# Patient Record
Sex: Female | Born: 1965 | Race: White | Hispanic: No | Marital: Married | Smoking: Never smoker
Health system: Southern US, Community
[De-identification: ages and names within clinical notes are randomized; demographics above are authoritative.]

## PROBLEM LIST (undated history)

## (undated) DIAGNOSIS — D649 Anemia, unspecified: Secondary | ICD-10-CM

## (undated) DIAGNOSIS — M549 Dorsalgia, unspecified: Secondary | ICD-10-CM

## (undated) DIAGNOSIS — G8929 Other chronic pain: Secondary | ICD-10-CM

## (undated) DIAGNOSIS — J45909 Unspecified asthma, uncomplicated: Secondary | ICD-10-CM

## (undated) HISTORY — PX: ABDOMINAL HYSTERECTOMY: SHX81

## (undated) HISTORY — PX: BREAST BIOPSY: SHX20

## (undated) HISTORY — PX: OTHER SURGICAL HISTORY: SHX169

## (undated) HISTORY — PX: FOOT SURGERY: SHX648

## (undated) HISTORY — PX: BACK SURGERY: SHX140

## (undated) HISTORY — PX: HERNIA REPAIR: SHX51

---

## 2014-06-11 ENCOUNTER — Emergency Department (HOSPITAL_COMMUNITY)
Admission: EM | Admit: 2014-06-11 | Discharge: 2014-06-11 | Disposition: A | Discharge status: Home / Self Care | Payer: BLUE CROSS/BLUE SHIELD | Attending: Emergency Medicine | Admitting: Emergency Medicine

## 2014-06-11 ENCOUNTER — Encounter (HOSPITAL_COMMUNITY): Payer: Self-pay | Admitting: Emergency Medicine

## 2014-06-11 ENCOUNTER — Emergency Department (HOSPITAL_COMMUNITY): Payer: BLUE CROSS/BLUE SHIELD

## 2014-06-11 DIAGNOSIS — Y998 Other external cause status: Secondary | ICD-10-CM | POA: Diagnosis not present

## 2014-06-11 DIAGNOSIS — Z79899 Other long term (current) drug therapy: Secondary | ICD-10-CM | POA: Diagnosis not present

## 2014-06-11 DIAGNOSIS — Y929 Unspecified place or not applicable: Secondary | ICD-10-CM | POA: Insufficient documentation

## 2014-06-11 DIAGNOSIS — S93402A Sprain of unspecified ligament of left ankle, initial encounter: Secondary | ICD-10-CM | POA: Diagnosis not present

## 2014-06-11 DIAGNOSIS — S90812A Abrasion, left foot, initial encounter: Secondary | ICD-10-CM | POA: Diagnosis not present

## 2014-06-11 DIAGNOSIS — W19XXXA Unspecified fall, initial encounter: Secondary | ICD-10-CM

## 2014-06-11 DIAGNOSIS — J45909 Unspecified asthma, uncomplicated: Secondary | ICD-10-CM | POA: Diagnosis not present

## 2014-06-11 DIAGNOSIS — G8929 Other chronic pain: Secondary | ICD-10-CM | POA: Diagnosis not present

## 2014-06-11 DIAGNOSIS — Z862 Personal history of diseases of the blood and blood-forming organs and certain disorders involving the immune mechanism: Secondary | ICD-10-CM | POA: Insufficient documentation

## 2014-06-11 DIAGNOSIS — Y9301 Activity, walking, marching and hiking: Secondary | ICD-10-CM | POA: Insufficient documentation

## 2014-06-11 DIAGNOSIS — S99912A Unspecified injury of left ankle, initial encounter: Secondary | ICD-10-CM | POA: Diagnosis present

## 2014-06-11 DIAGNOSIS — W109XXA Fall (on) (from) unspecified stairs and steps, initial encounter: Secondary | ICD-10-CM | POA: Diagnosis not present

## 2014-06-11 HISTORY — DX: Unspecified asthma, uncomplicated: J45.909

## 2014-06-11 HISTORY — DX: Dorsalgia, unspecified: M54.9

## 2014-06-11 HISTORY — DX: Other chronic pain: G89.29

## 2014-06-11 HISTORY — DX: Anemia, unspecified: D64.9

## 2014-06-11 NOTE — ED Notes (Signed)
Pt refuse crutches

## 2014-06-11 NOTE — ED Notes (Signed)
Pt states she was coming down the steps and they were wet and she slipped and fell  Pt is c/o left foot and ankle pain  Pt has some swelling and abrasions to the top of the foot

## 2014-06-11 NOTE — Discharge Instructions (Signed)
Use crutches as needed for assistance. Wear ankle brace as needed for support. Refer to attached documents for more information.

## 2014-06-11 NOTE — ED Provider Notes (Signed)
CSN: [id number]     Arrival date & time 06/11/14  2124 History   First MD Initiated Contact with Patient 06/11/14 2219   This chart was scribed for non-physician practitioner, Emilia BeckKaitlyn Porchia Sinkler, PA-C, working with Geoffery Lyonsouglas Delo, MD by Marica OtterNusrat Rahman, ED Scribe. This patient was seen in room WTR9/WTR9 and the patient's care was started at 11:29 PM.   Chief Complaint  Patient presents with  . Fall  . Ankle Injury  . Foot Pain   The history is provided by the patient. No language interpreter was used.   PCP: No primary care provider on file. HPI Comments: Heidi Bass is a 49 y.o. female, with PMH noted below, who presents to the Emergency Department complaining of a fall with associated left foot pain, left ankle pain, and superficial abrasions over the left foot  onset this evening. Pt was walking down some steps when she slipped and fell. Pt denies head trauma, loc, neck pain, or any other Sx at this time.   Past Medical History  Diagnosis Date  . Asthma   . Anemia   . Chronic back pain    Past Surgical History  Procedure Laterality Date  . Back surgery    . C sections    . Abdominal hysterectomy    . Foot surgery    . Hernia repair     No family history on file. History  Substance Use Topics  . Smoking status: Never Smoker   . Smokeless tobacco: Not on file  . Alcohol Use: No   OB History    No data available     Review of Systems  Musculoskeletal: Negative for neck pain.       Left foot pain and left ankle pain  Skin: Positive for wound (superficial abrasions to top of left foot).  All other systems reviewed and are negative.  Allergies  Oxycodone-acetaminophen  Home Medications   Prior to Admission medications   Medication Sig Start Date End Date Taking? Authorizing Provider  B Complex-C (B-COMPLEX WITH VITAMIN C) tablet Take 1 tablet by mouth daily.   Yes Historical Provider, MD  cyanocobalamin (,VITAMIN B-12,) 1000 MCG/ML injection Inject 1,000 mcg into the  muscle every 30 (thirty) days.  06/07/14  Yes Historical Provider, MD  MAGNESIUM CHLORIDE ER PO Take 1 tablet by mouth daily.   Yes Historical Provider, MD  naproxen sodium (ANAPROX) 220 MG tablet Take 440 mg by mouth 2 (two) times daily as needed (pain).   Yes Historical Provider, MD  traMADol (ULTRAM) 50 MG tablet Take 100 mg by mouth every 6 (six) hours as needed for moderate pain.   Yes Historical Provider, MD  VENTOLIN HFA 108 (90 BASE) MCG/ACT inhaler Inhale 2 puffs into the lungs every 4 (four) hours as needed for wheezing.  03/09/14   Historical Provider, MD   Triage Vitals: BP 125/79 mmHg  Pulse 57  Temp(Src) 98.6 F (37 C) (Oral)  Resp 18  SpO2 99% Physical Exam  Constitutional: She is oriented to person, place, and time. She appears well-developed and well-nourished. No distress.  HENT:  Head: Normocephalic and atraumatic.  Eyes: Conjunctivae and EOM are normal.  Neck: Neck supple.  Cardiovascular: Normal rate.   Pulmonary/Chest: Effort normal. No respiratory distress.  Musculoskeletal: Normal range of motion.  Limited ROM of left ankle due to pain, tenderness to palpation over medial and lateral malleolus, no obvious deformity.   Neurological: She is alert and oriented to person, place, and time.  Skin: Skin is  warm and dry.  Psychiatric: She has a normal mood and affect. Her behavior is normal.  Nursing note and vitals reviewed.   ED Course  Procedures (including critical care time) DIAGNOSTIC STUDIES: Oxygen Saturation is 99% on RA, nl by my interpretation.    COORDINATION OF CARE: 11:31 PM-Discussed treatment plan with pt at bedside and pt agreed to plan. Pt offered meds for pain, but declined.   Labs Review Labs Reviewed - No data to display  Imaging Review Dg Ankle Complete Left  06/11/2014   CLINICAL DATA:  Slip and fall down stairs, foot and ankle pain. Redness and swelling.  EXAM: LEFT ANKLE COMPLETE - 3+ VIEW  COMPARISON:  None.  FINDINGS: No fracture or  dislocation. The alignment and joint spaces are maintained. The ankle mortise is preserved. No focal soft tissue abnormality about the ankle.  IMPRESSION: Negative.   Electronically Signed   By: Rubye Oaks M.D.   On: 06/11/2014 23:14   Dg Foot Complete Left  06/11/2014   CLINICAL DATA:  Slip and fall down stairs. Foot and ankle injury, pain. Redness and swelling.  EXAM: LEFT FOOT - COMPLETE 3+ VIEW  COMPARISON:  None.  FINDINGS: No fracture or dislocation. The alignment and joint spaces are maintained. Minimal sclerotic density about the fourth metatarsal, likely bone island. 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 There is dorsal soft tissue edema.  IMPRESSION: No fracture or dislocation of the left foot. Dorsal soft tissue edema.   Electronically Signed   By: Rubye Oaks M.D.   On: 06/11/2014 23:15   SPLINT APPLICATION Date/Time: 11:53 PM Authorized by: Emilia Beck Consent: Verbal consent obtained. Risks and benefits: risks, benefits and alternatives were discussed Consent given by: patient Splint applied by: orthopedic technician Location details: left ankle Splint type: ASO brace Supplies used: ASO brace Post-procedure: The splinted body part was neurovascularly unchanged following the procedure. Patient tolerance: Patient tolerated the procedure well with no immediate complications.     EKG Interpretation None      MDM   Final diagnoses:  Fall, initial encounter  Left ankle sprain, initial encounter    Xray shows no acute changes. Patient will have ASO brace and instructions to rest, ice, and elevate.   I personally performed the services described in this documentation, which was scribed in my presence. The recorded information has been reviewed and is accurate.   [address] [place], PA-C 06/12/14 1610  Azalia Bilis, MD 06/12/14 [phone]

## 2014-12-18 ENCOUNTER — Other Ambulatory Visit: Payer: Self-pay

## 2014-12-18 DIAGNOSIS — Z1231 Encounter for screening mammogram for malignant neoplasm of breast: Secondary | ICD-10-CM

## 2015-01-12 ENCOUNTER — Ambulatory Visit
Admission: RE | Admit: 2015-01-12 | Discharge: 2015-01-12 | Disposition: A | Discharge status: Home / Self Care | Payer: BLUE CROSS/BLUE SHIELD | Source: Ambulatory Visit

## 2015-01-12 DIAGNOSIS — Z1231 Encounter for screening mammogram for malignant neoplasm of breast: Secondary | ICD-10-CM

## 2015-04-16 ENCOUNTER — Other Ambulatory Visit: Payer: Self-pay | Admitting: Neurosurgery

## 2015-04-16 DIAGNOSIS — M5416 Radiculopathy, lumbar region: Secondary | ICD-10-CM

## 2015-04-16 DIAGNOSIS — M546 Pain in thoracic spine: Secondary | ICD-10-CM

## 2015-04-16 DIAGNOSIS — G959 Disease of spinal cord, unspecified: Secondary | ICD-10-CM

## 2015-04-24 ENCOUNTER — Ambulatory Visit
Admission: RE | Admit: 2015-04-24 | Discharge: 2015-04-24 | Disposition: A | Discharge status: Home / Self Care | Payer: BLUE CROSS/BLUE SHIELD | Source: Ambulatory Visit | Attending: Neurosurgery | Admitting: Neurosurgery

## 2015-04-24 DIAGNOSIS — M5416 Radiculopathy, lumbar region: Secondary | ICD-10-CM

## 2015-04-24 DIAGNOSIS — M546 Pain in thoracic spine: Secondary | ICD-10-CM

## 2015-04-24 DIAGNOSIS — G959 Disease of spinal cord, unspecified: Secondary | ICD-10-CM

## 2015-04-24 MED ORDER — GADOBENATE DIMEGLUMINE 529 MG/ML IV SOLN
15.0000 mL | Freq: Once | INTRAVENOUS | Status: AC | PRN
Start: 2015-04-24 — End: 2015-04-24
  Administered 2015-04-24: 15 mL via INTRAVENOUS

## 2016-03-24 ENCOUNTER — Other Ambulatory Visit: Payer: Self-pay | Admitting: Obstetrics & Gynecology

## 2016-03-24 DIAGNOSIS — Z1231 Encounter for screening mammogram for malignant neoplasm of breast: Secondary | ICD-10-CM

## 2016-04-11 ENCOUNTER — Ambulatory Visit
Admission: RE | Admit: 2016-04-11 | Discharge: 2016-04-11 | Disposition: A | Discharge status: Home / Self Care | Payer: BLUE CROSS/BLUE SHIELD | Source: Ambulatory Visit | Attending: Obstetrics & Gynecology | Admitting: Obstetrics & Gynecology

## 2016-04-11 DIAGNOSIS — Z1231 Encounter for screening mammogram for malignant neoplasm of breast: Secondary | ICD-10-CM

## 2017-03-09 ENCOUNTER — Other Ambulatory Visit: Payer: Self-pay | Admitting: Obstetrics & Gynecology

## 2017-03-09 DIAGNOSIS — Z1231 Encounter for screening mammogram for malignant neoplasm of breast: Secondary | ICD-10-CM

## 2017-04-13 ENCOUNTER — Ambulatory Visit: Payer: BLUE CROSS/BLUE SHIELD

## 2017-04-27 ENCOUNTER — Ambulatory Visit
Admission: RE | Admit: 2017-04-27 | Discharge: 2017-04-27 | Disposition: A | Discharge status: Home / Self Care | Payer: PRIVATE HEALTH INSURANCE | Source: Ambulatory Visit | Attending: Obstetrics & Gynecology | Admitting: Obstetrics & Gynecology

## 2017-04-27 DIAGNOSIS — Z1231 Encounter for screening mammogram for malignant neoplasm of breast: Secondary | ICD-10-CM

## 2018-06-04 ENCOUNTER — Other Ambulatory Visit: Payer: Self-pay | Admitting: Obstetrics & Gynecology

## 2018-06-04 DIAGNOSIS — Z1231 Encounter for screening mammogram for malignant neoplasm of breast: Secondary | ICD-10-CM

## 2018-07-22 ENCOUNTER — Other Ambulatory Visit: Payer: Self-pay

## 2018-07-22 ENCOUNTER — Ambulatory Visit
Admission: RE | Admit: 2018-07-22 | Discharge: 2018-07-22 | Disposition: A | Discharge status: Home / Self Care | Payer: PRIVATE HEALTH INSURANCE | Source: Ambulatory Visit | Attending: Obstetrics & Gynecology | Admitting: Obstetrics & Gynecology

## 2018-07-22 DIAGNOSIS — Z1231 Encounter for screening mammogram for malignant neoplasm of breast: Secondary | ICD-10-CM

## 2019-08-23 ENCOUNTER — Other Ambulatory Visit: Payer: Self-pay | Admitting: Internal Medicine

## 2019-08-23 DIAGNOSIS — R1011 Right upper quadrant pain: Secondary | ICD-10-CM

## 2019-08-24 ENCOUNTER — Other Ambulatory Visit: Payer: Self-pay | Admitting: Internal Medicine

## 2019-08-24 ENCOUNTER — Ambulatory Visit
Admission: RE | Admit: 2019-08-24 | Discharge: 2019-08-24 | Disposition: A | Discharge status: Home / Self Care | Payer: PRIVATE HEALTH INSURANCE | Source: Ambulatory Visit | Attending: Internal Medicine | Admitting: Internal Medicine

## 2019-08-24 DIAGNOSIS — R1013 Epigastric pain: Secondary | ICD-10-CM

## 2019-08-24 DIAGNOSIS — R1011 Right upper quadrant pain: Secondary | ICD-10-CM

## 2019-08-26 ENCOUNTER — Other Ambulatory Visit: Payer: Self-pay | Admitting: Obstetrics and Gynecology

## 2019-08-26 DIAGNOSIS — Z1231 Encounter for screening mammogram for malignant neoplasm of breast: Secondary | ICD-10-CM

## 2019-08-30 ENCOUNTER — Other Ambulatory Visit: Payer: PRIVATE HEALTH INSURANCE

## 2019-09-05 ENCOUNTER — Ambulatory Visit
Admission: RE | Admit: 2019-09-05 | Discharge: 2019-09-05 | Disposition: A | Discharge status: Home / Self Care | Payer: PRIVATE HEALTH INSURANCE | Source: Ambulatory Visit

## 2019-09-05 ENCOUNTER — Other Ambulatory Visit: Payer: Self-pay

## 2019-09-05 DIAGNOSIS — Z1231 Encounter for screening mammogram for malignant neoplasm of breast: Secondary | ICD-10-CM

## 2019-09-06 ENCOUNTER — Ambulatory Visit
Admission: RE | Admit: 2019-09-06 | Discharge: 2019-09-06 | Disposition: A | Discharge status: Home / Self Care | Payer: PRIVATE HEALTH INSURANCE | Source: Ambulatory Visit | Attending: Internal Medicine | Admitting: Internal Medicine

## 2019-09-06 DIAGNOSIS — R1013 Epigastric pain: Secondary | ICD-10-CM

## 2019-09-06 MED ORDER — IOPAMIDOL (ISOVUE-300) INJECTION 61%
100.0000 mL | Freq: Once | INTRAVENOUS | Status: AC | PRN
  Administered 2019-09-06: 100 mL via INTRAVENOUS

## 2019-12-10 ENCOUNTER — Ambulatory Visit: Payer: PRIVATE HEALTH INSURANCE | Attending: Internal Medicine

## 2019-12-10 DIAGNOSIS — Z23 Encounter for immunization: Secondary | ICD-10-CM

## 2019-12-10 NOTE — Progress Notes (Signed)
   Covid-19 Vaccination Clinic  Name:  Heidi Bass    MRN: [id number] DOB: [birth year]  12/10/2019  [name] was observed post Covid-19 immunization for 15 minutes without incident. She was provided with Vaccine Information Sheet and instruction to access the V-Safe system.   [name] was instructed to call [phone] with any severe reactions post vaccine: Marland Kitchen Difficulty breathing  . Swelling of face and throat  . A fast heartbeat  . A bad rash all over body  . Dizziness and weakness   Immunizations Administered    Name Date Dose VIS Date Route   Pfizer COVID-19 Vaccine 12/10/2019  1:12 PM 0.3 mL 10/26/2019 Intramuscular   Manufacturer: [employer], [employer]   Lot: [id number]   NDC: [address]-5

## 2019-12-27 ENCOUNTER — Other Ambulatory Visit: Payer: Self-pay | Admitting: Internal Medicine

## 2019-12-27 DIAGNOSIS — R519 Headache, unspecified: Secondary | ICD-10-CM

## 2020-01-22 ENCOUNTER — Other Ambulatory Visit: Payer: PRIVATE HEALTH INSURANCE

## 2020-02-02 ENCOUNTER — Other Ambulatory Visit: Payer: PRIVATE HEALTH INSURANCE

## 2020-02-08 ENCOUNTER — Other Ambulatory Visit: Payer: PRIVATE HEALTH INSURANCE

## 2020-06-08 DIAGNOSIS — I10 Essential (primary) hypertension: Secondary | ICD-10-CM | POA: Diagnosis not present

## 2020-06-08 DIAGNOSIS — Z0000 Encounter for general adult medical examination without abnormal findings: Secondary | ICD-10-CM | POA: Diagnosis not present

## 2020-06-08 DIAGNOSIS — Z13220 Encounter for screening for lipoid disorders: Secondary | ICD-10-CM | POA: Diagnosis not present

## 2020-06-08 DIAGNOSIS — E538 Deficiency of other specified B group vitamins: Secondary | ICD-10-CM | POA: Diagnosis not present

## 2020-10-12 ENCOUNTER — Other Ambulatory Visit: Payer: Self-pay | Admitting: Internal Medicine

## 2020-10-12 DIAGNOSIS — Z1231 Encounter for screening mammogram for malignant neoplasm of breast: Secondary | ICD-10-CM

## 2020-10-25 ENCOUNTER — Other Ambulatory Visit: Payer: Self-pay | Admitting: Internal Medicine

## 2020-10-25 DIAGNOSIS — Z1231 Encounter for screening mammogram for malignant neoplasm of breast: Secondary | ICD-10-CM

## 2020-11-12 DIAGNOSIS — G8929 Other chronic pain: Secondary | ICD-10-CM | POA: Diagnosis not present

## 2020-11-12 DIAGNOSIS — I10 Essential (primary) hypertension: Secondary | ICD-10-CM | POA: Diagnosis not present

## 2020-11-12 DIAGNOSIS — J029 Acute pharyngitis, unspecified: Secondary | ICD-10-CM | POA: Diagnosis not present

## 2020-11-12 DIAGNOSIS — Z20822 Contact with and (suspected) exposure to covid-19: Secondary | ICD-10-CM | POA: Diagnosis not present

## 2020-11-15 DIAGNOSIS — Z1231 Encounter for screening mammogram for malignant neoplasm of breast: Secondary | ICD-10-CM

## 2020-11-27 ENCOUNTER — Ambulatory Visit
Admission: RE | Admit: 2020-11-27 | Discharge: 2020-11-27 | Disposition: A | Discharge status: Home / Self Care | Payer: BC Managed Care – PPO | Source: Ambulatory Visit

## 2020-11-27 ENCOUNTER — Other Ambulatory Visit: Payer: Self-pay

## 2020-11-27 DIAGNOSIS — Z1231 Encounter for screening mammogram for malignant neoplasm of breast: Secondary | ICD-10-CM

## 2020-12-10 DIAGNOSIS — M545 Low back pain, unspecified: Secondary | ICD-10-CM | POA: Diagnosis not present

## 2020-12-10 DIAGNOSIS — G8929 Other chronic pain: Secondary | ICD-10-CM | POA: Diagnosis not present

## 2020-12-10 DIAGNOSIS — I10 Essential (primary) hypertension: Secondary | ICD-10-CM | POA: Diagnosis not present

## 2020-12-10 DIAGNOSIS — D510 Vitamin B12 deficiency anemia due to intrinsic factor deficiency: Secondary | ICD-10-CM | POA: Diagnosis not present

## 2021-03-04 DIAGNOSIS — G8929 Other chronic pain: Secondary | ICD-10-CM | POA: Diagnosis not present

## 2021-03-04 DIAGNOSIS — D510 Vitamin B12 deficiency anemia due to intrinsic factor deficiency: Secondary | ICD-10-CM | POA: Diagnosis not present

## 2021-03-04 DIAGNOSIS — M545 Low back pain, unspecified: Secondary | ICD-10-CM | POA: Diagnosis not present

## 2021-03-04 DIAGNOSIS — I10 Essential (primary) hypertension: Secondary | ICD-10-CM | POA: Diagnosis not present

## 2021-03-22 DIAGNOSIS — D2262 Melanocytic nevi of left upper limb, including shoulder: Secondary | ICD-10-CM | POA: Diagnosis not present

## 2021-03-22 DIAGNOSIS — D2261 Melanocytic nevi of right upper limb, including shoulder: Secondary | ICD-10-CM | POA: Diagnosis not present

## 2021-03-22 DIAGNOSIS — D2271 Melanocytic nevi of right lower limb, including hip: Secondary | ICD-10-CM | POA: Diagnosis not present

## 2021-03-22 DIAGNOSIS — L570 Actinic keratosis: Secondary | ICD-10-CM | POA: Diagnosis not present

## 2021-03-22 DIAGNOSIS — D225 Melanocytic nevi of trunk: Secondary | ICD-10-CM | POA: Diagnosis not present

## 2021-03-22 DIAGNOSIS — X32XXXA Exposure to sunlight, initial encounter: Secondary | ICD-10-CM | POA: Diagnosis not present

## 2021-05-01 DIAGNOSIS — Z1389 Encounter for screening for other disorder: Secondary | ICD-10-CM | POA: Diagnosis not present

## 2021-05-01 DIAGNOSIS — Z01419 Encounter for gynecological examination (general) (routine) without abnormal findings: Secondary | ICD-10-CM | POA: Diagnosis not present

## 2021-05-01 DIAGNOSIS — N951 Menopausal and female climacteric states: Secondary | ICD-10-CM | POA: Diagnosis not present

## 2021-07-03 DIAGNOSIS — R1013 Epigastric pain: Secondary | ICD-10-CM | POA: Diagnosis not present

## 2021-07-03 DIAGNOSIS — Z6821 Body mass index (BMI) 21.0-21.9, adult: Secondary | ICD-10-CM | POA: Diagnosis not present

## 2021-07-03 DIAGNOSIS — R110 Nausea: Secondary | ICD-10-CM | POA: Diagnosis not present

## 2021-07-03 DIAGNOSIS — R630 Anorexia: Secondary | ICD-10-CM | POA: Diagnosis not present

## 2021-08-28 DIAGNOSIS — I10 Essential (primary) hypertension: Secondary | ICD-10-CM | POA: Diagnosis not present

## 2021-08-28 DIAGNOSIS — J4520 Mild intermittent asthma, uncomplicated: Secondary | ICD-10-CM | POA: Diagnosis not present

## 2021-08-28 DIAGNOSIS — K582 Mixed irritable bowel syndrome: Secondary | ICD-10-CM | POA: Diagnosis not present

## 2021-08-28 DIAGNOSIS — G250 Essential tremor: Secondary | ICD-10-CM | POA: Diagnosis not present

## 2021-11-22 ENCOUNTER — Other Ambulatory Visit: Payer: Self-pay | Admitting: Internal Medicine

## 2021-11-22 DIAGNOSIS — Z1231 Encounter for screening mammogram for malignant neoplasm of breast: Secondary | ICD-10-CM

## 2022-01-15 ENCOUNTER — Ambulatory Visit
Admission: RE | Admit: 2022-01-15 | Discharge: 2022-01-15 | Disposition: A | Discharge status: Home / Self Care | Payer: BC Managed Care – PPO | Source: Ambulatory Visit | Attending: Internal Medicine | Admitting: Internal Medicine

## 2022-01-15 DIAGNOSIS — Z1231 Encounter for screening mammogram for malignant neoplasm of breast: Secondary | ICD-10-CM

## 2022-01-21 ENCOUNTER — Telehealth: Payer: Self-pay | Admitting: *Deleted

## 2022-01-21 NOTE — Patient Outreach (Signed)
  Care Coordination   01/21/2022 Name: Heidi Bass MRN: [id number] DOB: March 29, 1965   Care Coordination Outreach Attempts:  An unsuccessful telephone outreach was attempted today to offer the patient information about available care coordination services as a benefit of their health plan.   Follow Up Plan:  Additional outreach attempts will be made to offer the patient care coordination information and services.   Encounter Outcome:  No Answer   Care Coordination Interventions:  No, not indicated    [name], RN Care Management Coordinator [place] Office [phone]

## 2022-03-03 DIAGNOSIS — J4520 Mild intermittent asthma, uncomplicated: Secondary | ICD-10-CM | POA: Diagnosis not present

## 2022-03-03 DIAGNOSIS — K582 Mixed irritable bowel syndrome: Secondary | ICD-10-CM | POA: Diagnosis not present

## 2022-03-03 DIAGNOSIS — G250 Essential tremor: Secondary | ICD-10-CM | POA: Diagnosis not present

## 2022-03-03 DIAGNOSIS — I10 Essential (primary) hypertension: Secondary | ICD-10-CM | POA: Diagnosis not present

## 2022-03-03 IMAGING — MG MM DIGITAL SCREENING BILAT W/ TOMO AND CAD
8 series · 8 of 24 positions shown · non-contrast
Comparison: Previous exam(s).

CLINICAL DATA: Screening.

EXAM:
DIGITAL SCREENING BILATERAL MAMMOGRAM WITH TOMOSYNTHESIS AND CAD
TECHNIQUE: Bilateral screening digital craniocaudal and mediolateral oblique
mammograms were obtained. Bilateral screening digital breast
tomosynthesis was performed. The images were evaluated with
computer-aided detection.

[R MLO synth-2D]
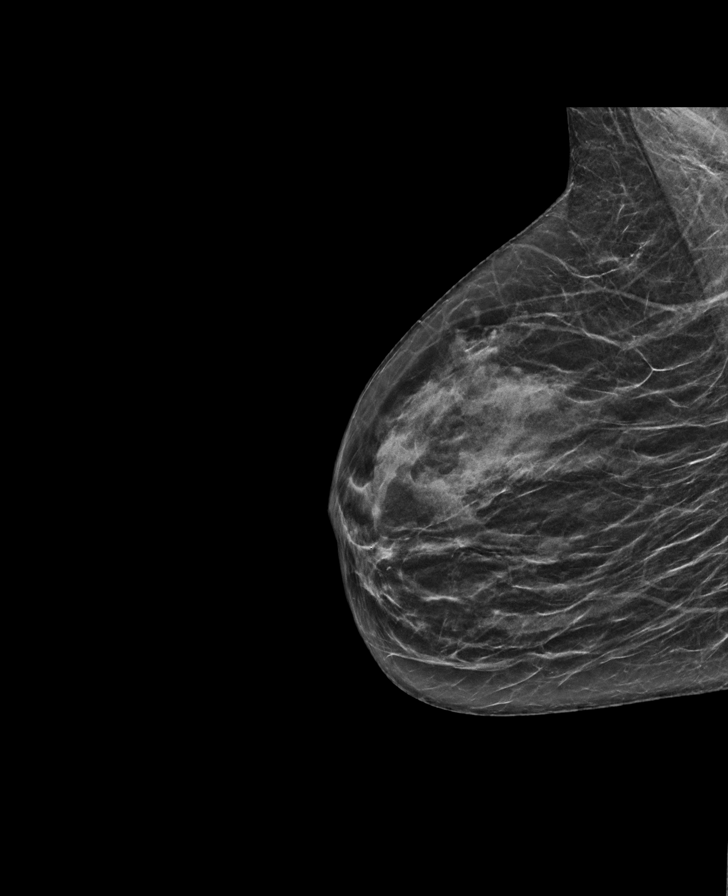

[R CC synth-2D]
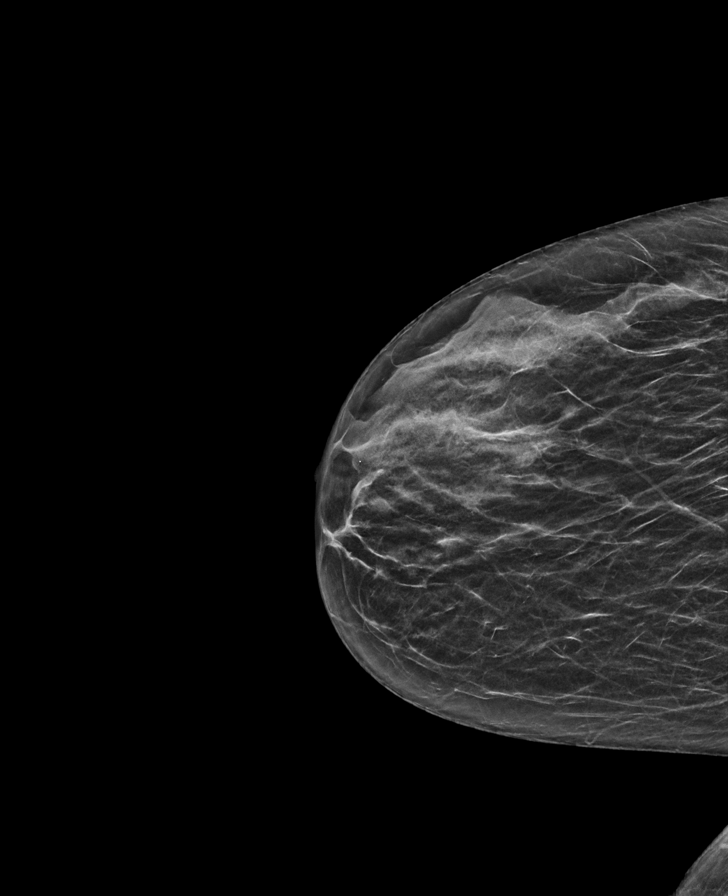

[L CC synth-2D]
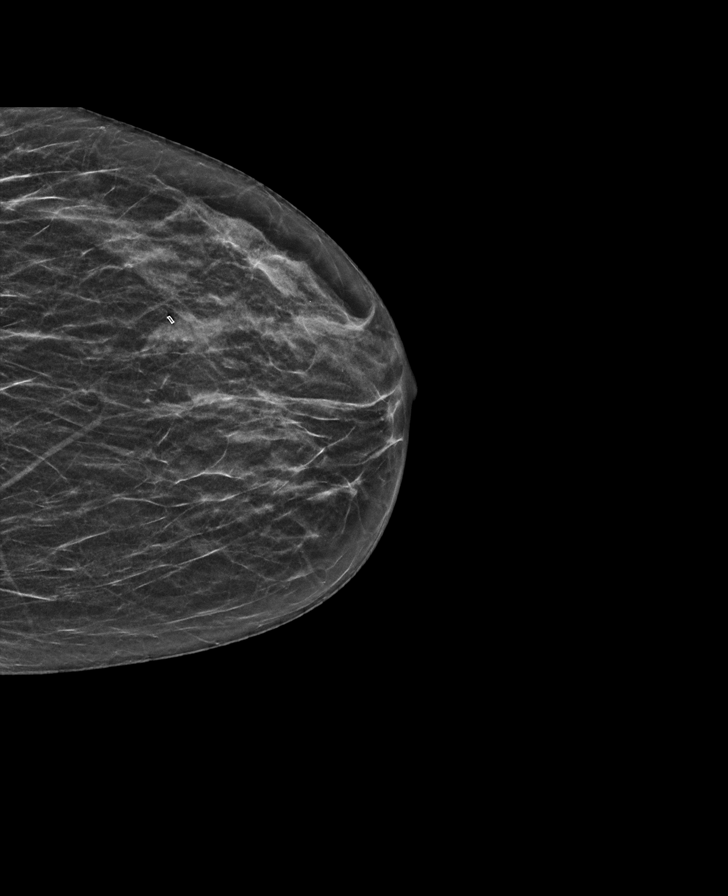

[L MLO synth-2D]
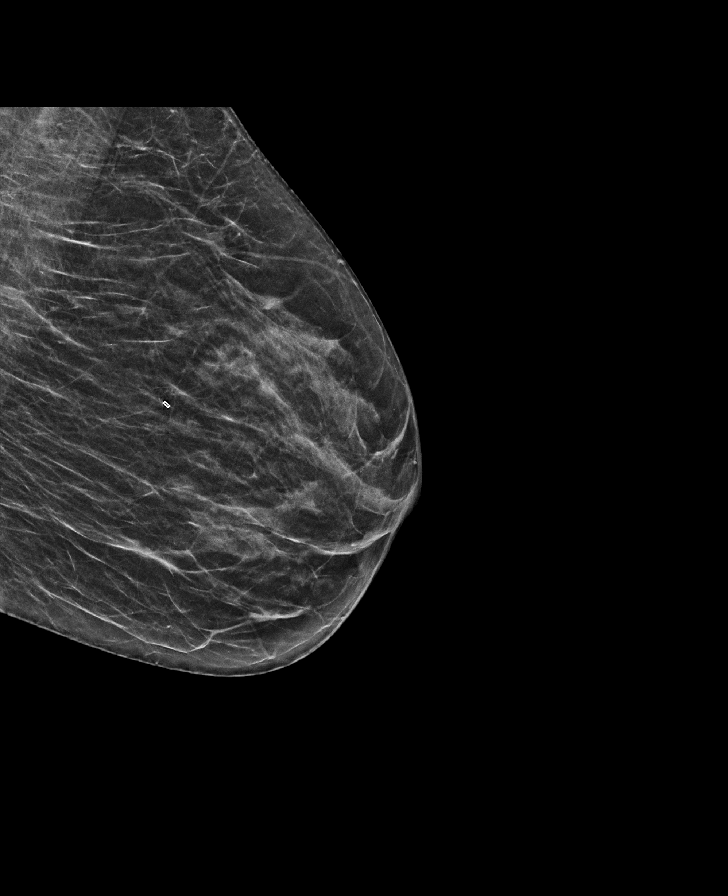

[R CC tomo · tomo slice 23/45.0]
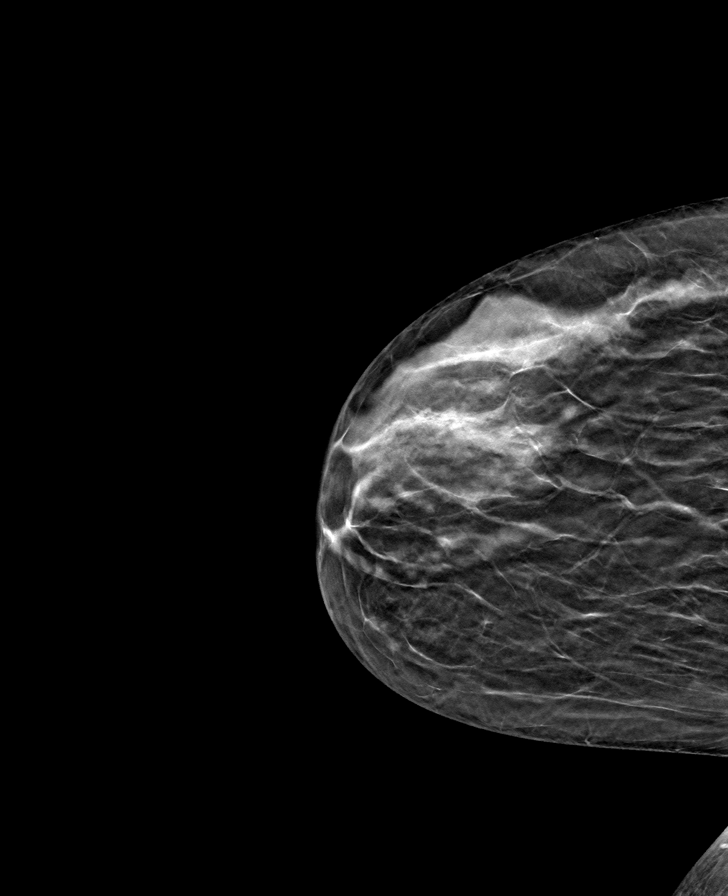

[L MLO tomo · tomo slice 23/45.0]
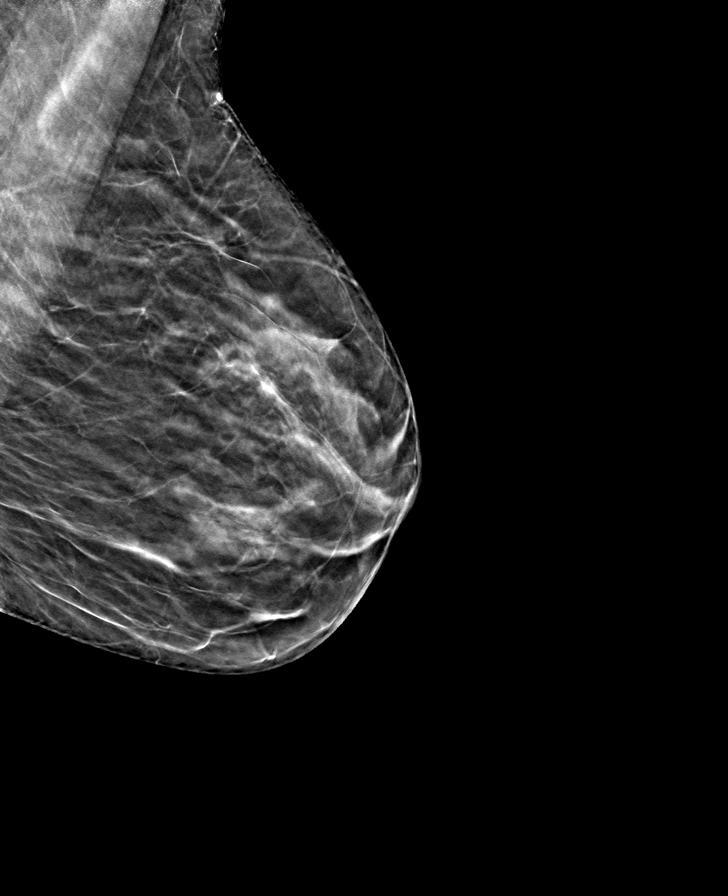

[R MLO tomo · tomo slice 25/49.0]
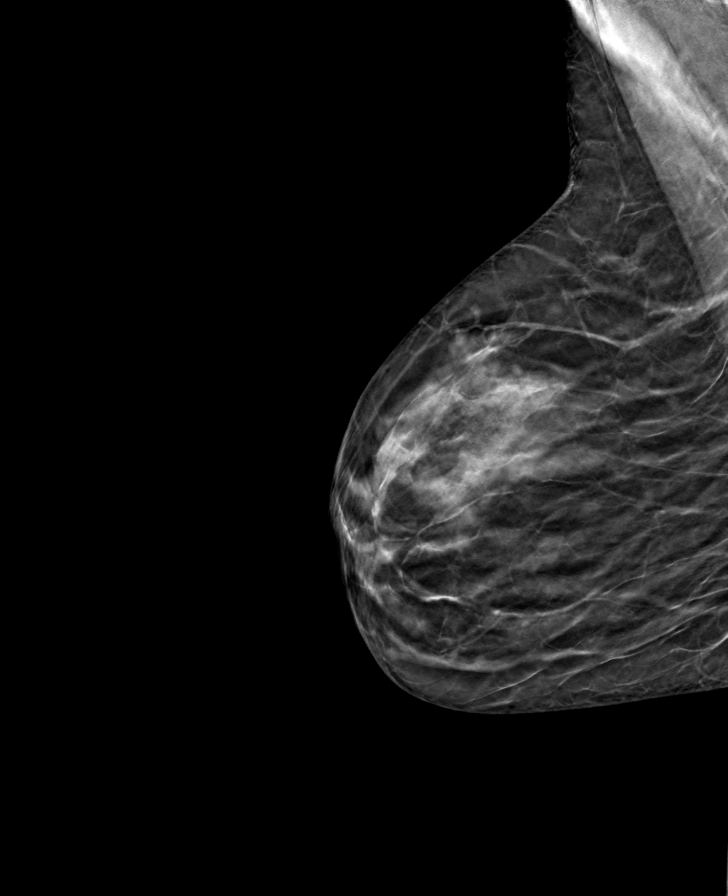

[L CC tomo · tomo slice 21/41.0]
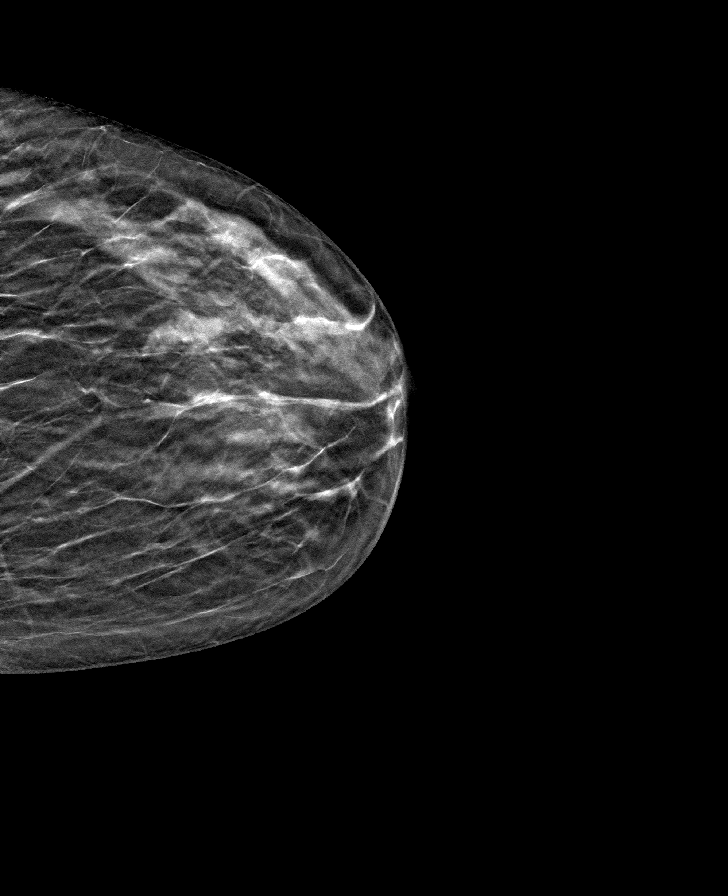

[8 of 24 positions shown; findings below may reference images not displayed]

ACR Breast Density Category b: There are scattered areas of
fibroglandular density.
FINDINGS: There are no findings suspicious for malignancy.
IMPRESSION: No mammographic evidence of malignancy. A result letter of this
screening mammogram will be mailed directly to the patient.

RECOMMENDATION:
Screening mammogram in one year. (Code:51-O-LD2)

BI-RADS CATEGORY  1: Negative.

## 2022-03-12 DIAGNOSIS — M792 Neuralgia and neuritis, unspecified: Secondary | ICD-10-CM | POA: Diagnosis not present

## 2022-06-25 DIAGNOSIS — G250 Essential tremor: Secondary | ICD-10-CM | POA: Diagnosis not present

## 2022-07-09 DIAGNOSIS — N952 Postmenopausal atrophic vaginitis: Secondary | ICD-10-CM | POA: Diagnosis not present

## 2022-07-09 DIAGNOSIS — Z01419 Encounter for gynecological examination (general) (routine) without abnormal findings: Secondary | ICD-10-CM | POA: Diagnosis not present

## 2022-09-16 DIAGNOSIS — N952 Postmenopausal atrophic vaginitis: Secondary | ICD-10-CM | POA: Diagnosis not present

## 2022-09-23 DIAGNOSIS — Z86010 Personal history of colonic polyps: Secondary | ICD-10-CM | POA: Diagnosis not present

## 2022-09-23 DIAGNOSIS — K5730 Diverticulosis of large intestine without perforation or abscess without bleeding: Secondary | ICD-10-CM | POA: Diagnosis not present

## 2022-10-01 DIAGNOSIS — G250 Essential tremor: Secondary | ICD-10-CM | POA: Diagnosis not present

## 2022-10-01 DIAGNOSIS — Z01818 Encounter for other preprocedural examination: Secondary | ICD-10-CM | POA: Diagnosis not present

## 2022-10-08 DIAGNOSIS — Z0000 Encounter for general adult medical examination without abnormal findings: Secondary | ICD-10-CM | POA: Diagnosis not present

## 2022-10-08 DIAGNOSIS — Z23 Encounter for immunization: Secondary | ICD-10-CM | POA: Diagnosis not present

## 2022-10-08 DIAGNOSIS — E7800 Pure hypercholesterolemia, unspecified: Secondary | ICD-10-CM | POA: Diagnosis not present

## 2022-10-08 DIAGNOSIS — G250 Essential tremor: Secondary | ICD-10-CM | POA: Diagnosis not present

## 2022-10-08 DIAGNOSIS — D510 Vitamin B12 deficiency anemia due to intrinsic factor deficiency: Secondary | ICD-10-CM | POA: Diagnosis not present

## 2022-10-08 DIAGNOSIS — I10 Essential (primary) hypertension: Secondary | ICD-10-CM | POA: Diagnosis not present

## 2022-10-30 DIAGNOSIS — G250 Essential tremor: Secondary | ICD-10-CM | POA: Diagnosis not present

## 2022-10-30 DIAGNOSIS — R41844 Frontal lobe and executive function deficit: Secondary | ICD-10-CM | POA: Diagnosis not present

## 2023-01-12 DIAGNOSIS — G250 Essential tremor: Secondary | ICD-10-CM | POA: Diagnosis not present

## 2023-01-12 DIAGNOSIS — Z01818 Encounter for other preprocedural examination: Secondary | ICD-10-CM | POA: Diagnosis not present

## 2023-01-13 DIAGNOSIS — J45909 Unspecified asthma, uncomplicated: Secondary | ICD-10-CM | POA: Diagnosis not present

## 2023-01-13 DIAGNOSIS — I10 Essential (primary) hypertension: Secondary | ICD-10-CM | POA: Diagnosis not present

## 2023-01-15 DIAGNOSIS — Z79899 Other long term (current) drug therapy: Secondary | ICD-10-CM | POA: Diagnosis not present

## 2023-01-15 DIAGNOSIS — G250 Essential tremor: Secondary | ICD-10-CM | POA: Diagnosis not present

## 2023-01-15 DIAGNOSIS — I10 Essential (primary) hypertension: Secondary | ICD-10-CM | POA: Diagnosis not present

## 2023-01-15 DIAGNOSIS — Z7951 Long term (current) use of inhaled steroids: Secondary | ICD-10-CM | POA: Diagnosis not present

## 2023-01-15 DIAGNOSIS — R251 Tremor, unspecified: Secondary | ICD-10-CM | POA: Diagnosis not present

## 2023-01-15 DIAGNOSIS — J45909 Unspecified asthma, uncomplicated: Secondary | ICD-10-CM | POA: Diagnosis not present

## 2023-01-16 DIAGNOSIS — G250 Essential tremor: Secondary | ICD-10-CM | POA: Diagnosis not present

## 2023-01-16 DIAGNOSIS — J45909 Unspecified asthma, uncomplicated: Secondary | ICD-10-CM | POA: Diagnosis not present

## 2023-01-16 DIAGNOSIS — Z7951 Long term (current) use of inhaled steroids: Secondary | ICD-10-CM | POA: Diagnosis not present

## 2023-01-16 DIAGNOSIS — I10 Essential (primary) hypertension: Secondary | ICD-10-CM | POA: Diagnosis not present

## 2023-01-16 DIAGNOSIS — Z79899 Other long term (current) drug therapy: Secondary | ICD-10-CM | POA: Diagnosis not present

## 2023-02-05 DIAGNOSIS — G250 Essential tremor: Secondary | ICD-10-CM | POA: Diagnosis not present

## 2023-02-17 DIAGNOSIS — G250 Essential tremor: Secondary | ICD-10-CM | POA: Diagnosis not present

## 2023-02-26 DIAGNOSIS — G250 Essential tremor: Secondary | ICD-10-CM | POA: Diagnosis not present

## 2023-02-26 DIAGNOSIS — Z9689 Presence of other specified functional implants: Secondary | ICD-10-CM | POA: Diagnosis not present

## 2023-03-27 DIAGNOSIS — G250 Essential tremor: Secondary | ICD-10-CM | POA: Diagnosis not present

## 2023-03-27 DIAGNOSIS — Z9689 Presence of other specified functional implants: Secondary | ICD-10-CM | POA: Diagnosis not present

## 2023-04-02 ENCOUNTER — Other Ambulatory Visit: Payer: Self-pay | Admitting: Internal Medicine

## 2023-04-02 DIAGNOSIS — Z1231 Encounter for screening mammogram for malignant neoplasm of breast: Secondary | ICD-10-CM

## 2023-04-02 DIAGNOSIS — D2262 Melanocytic nevi of left upper limb, including shoulder: Secondary | ICD-10-CM | POA: Diagnosis not present

## 2023-04-02 DIAGNOSIS — D2272 Melanocytic nevi of left lower limb, including hip: Secondary | ICD-10-CM | POA: Diagnosis not present

## 2023-04-02 DIAGNOSIS — D225 Melanocytic nevi of trunk: Secondary | ICD-10-CM | POA: Diagnosis not present

## 2023-04-02 DIAGNOSIS — D485 Neoplasm of uncertain behavior of skin: Secondary | ICD-10-CM | POA: Diagnosis not present

## 2023-04-02 DIAGNOSIS — D2261 Melanocytic nevi of right upper limb, including shoulder: Secondary | ICD-10-CM | POA: Diagnosis not present

## 2023-04-02 DIAGNOSIS — D2361 Other benign neoplasm of skin of right upper limb, including shoulder: Secondary | ICD-10-CM | POA: Diagnosis not present

## 2023-04-03 ENCOUNTER — Ambulatory Visit
Admission: RE | Admit: 2023-04-03 | Discharge: 2023-04-03 | Disposition: A | Discharge status: Home / Self Care | Source: Ambulatory Visit | Attending: Internal Medicine | Admitting: Internal Medicine

## 2023-04-03 DIAGNOSIS — Z1231 Encounter for screening mammogram for malignant neoplasm of breast: Secondary | ICD-10-CM | POA: Diagnosis not present

## 2023-04-09 DIAGNOSIS — G250 Essential tremor: Secondary | ICD-10-CM | POA: Diagnosis not present

## 2023-04-09 DIAGNOSIS — D510 Vitamin B12 deficiency anemia due to intrinsic factor deficiency: Secondary | ICD-10-CM | POA: Diagnosis not present

## 2023-04-09 DIAGNOSIS — G8929 Other chronic pain: Secondary | ICD-10-CM | POA: Diagnosis not present

## 2023-04-09 DIAGNOSIS — I10 Essential (primary) hypertension: Secondary | ICD-10-CM | POA: Diagnosis not present

## 2023-04-13 DIAGNOSIS — M545 Low back pain, unspecified: Secondary | ICD-10-CM | POA: Diagnosis not present

## 2023-04-13 DIAGNOSIS — M5416 Radiculopathy, lumbar region: Secondary | ICD-10-CM | POA: Diagnosis not present

## 2023-06-04 DIAGNOSIS — D2261 Melanocytic nevi of right upper limb, including shoulder: Secondary | ICD-10-CM | POA: Diagnosis not present

## 2023-06-04 DIAGNOSIS — D2361 Other benign neoplasm of skin of right upper limb, including shoulder: Secondary | ICD-10-CM | POA: Diagnosis not present

## 2023-06-19 DIAGNOSIS — G250 Essential tremor: Secondary | ICD-10-CM | POA: Diagnosis not present

## 2023-06-19 DIAGNOSIS — Z9689 Presence of other specified functional implants: Secondary | ICD-10-CM | POA: Diagnosis not present

## 2023-06-26 DIAGNOSIS — M545 Low back pain, unspecified: Secondary | ICD-10-CM | POA: Diagnosis not present

## 2023-06-26 DIAGNOSIS — Z9889 Other specified postprocedural states: Secondary | ICD-10-CM | POA: Diagnosis not present

## 2023-06-26 DIAGNOSIS — G96191 Perineural cyst: Secondary | ICD-10-CM | POA: Diagnosis not present

## 2023-06-26 DIAGNOSIS — M5416 Radiculopathy, lumbar region: Secondary | ICD-10-CM | POA: Diagnosis not present

## 2023-06-26 DIAGNOSIS — Z981 Arthrodesis status: Secondary | ICD-10-CM | POA: Diagnosis not present

## 2023-07-13 DIAGNOSIS — M545 Low back pain, unspecified: Secondary | ICD-10-CM | POA: Diagnosis not present

## 2023-07-14 ENCOUNTER — Other Ambulatory Visit: Payer: Self-pay | Admitting: Orthopedic Surgery

## 2023-07-14 DIAGNOSIS — M5416 Radiculopathy, lumbar region: Secondary | ICD-10-CM

## 2023-07-14 DIAGNOSIS — Z01419 Encounter for gynecological examination (general) (routine) without abnormal findings: Secondary | ICD-10-CM | POA: Diagnosis not present

## 2023-07-14 DIAGNOSIS — N952 Postmenopausal atrophic vaginitis: Secondary | ICD-10-CM | POA: Diagnosis not present

## 2023-07-24 ENCOUNTER — Ambulatory Visit
Admission: RE | Admit: 2023-07-24 | Discharge: 2023-07-24 | Disposition: A | Discharge status: Home / Self Care | Source: Ambulatory Visit | Attending: Orthopedic Surgery | Admitting: Orthopedic Surgery

## 2023-07-24 DIAGNOSIS — M5416 Radiculopathy, lumbar region: Secondary | ICD-10-CM

## 2023-07-24 DIAGNOSIS — M533 Sacrococcygeal disorders, not elsewhere classified: Secondary | ICD-10-CM | POA: Diagnosis not present

## 2023-07-24 MED ORDER — IOPAMIDOL (ISOVUE-M 200) INJECTION 41%
1.0000 mL | Freq: Once | INTRAMUSCULAR | Status: AC | PRN
  Administered 2023-07-24: 1 mL via INTRATHECAL

## 2023-08-05 DIAGNOSIS — G250 Essential tremor: Secondary | ICD-10-CM | POA: Diagnosis not present

## 2023-08-14 DIAGNOSIS — M533 Sacrococcygeal disorders, not elsewhere classified: Secondary | ICD-10-CM | POA: Diagnosis not present

## 2023-08-17 ENCOUNTER — Other Ambulatory Visit: Payer: Self-pay | Admitting: Orthopedic Surgery

## 2023-08-17 DIAGNOSIS — M5416 Radiculopathy, lumbar region: Secondary | ICD-10-CM

## 2023-09-09 ENCOUNTER — Other Ambulatory Visit: Payer: Self-pay

## 2023-09-11 ENCOUNTER — Ambulatory Visit
Admission: RE | Admit: 2023-09-11 | Discharge: 2023-09-11 | Disposition: A | Discharge status: Home / Self Care | Payer: Self-pay | Source: Ambulatory Visit | Attending: Orthopedic Surgery | Admitting: Orthopedic Surgery

## 2023-09-11 DIAGNOSIS — M5416 Radiculopathy, lumbar region: Secondary | ICD-10-CM

## 2023-09-11 DIAGNOSIS — M533 Sacrococcygeal disorders, not elsewhere classified: Secondary | ICD-10-CM | POA: Diagnosis not present

## 2023-10-09 DIAGNOSIS — M533 Sacrococcygeal disorders, not elsewhere classified: Secondary | ICD-10-CM | POA: Diagnosis not present

## 2023-10-27 DIAGNOSIS — M533 Sacrococcygeal disorders, not elsewhere classified: Secondary | ICD-10-CM | POA: Diagnosis not present

## 2023-10-27 DIAGNOSIS — M6281 Muscle weakness (generalized): Secondary | ICD-10-CM | POA: Diagnosis not present

## 2023-11-05 DIAGNOSIS — M6281 Muscle weakness (generalized): Secondary | ICD-10-CM | POA: Diagnosis not present

## 2023-11-05 DIAGNOSIS — M533 Sacrococcygeal disorders, not elsewhere classified: Secondary | ICD-10-CM | POA: Diagnosis not present

## 2023-11-10 DIAGNOSIS — G8929 Other chronic pain: Secondary | ICD-10-CM | POA: Diagnosis not present

## 2023-11-10 DIAGNOSIS — Z23 Encounter for immunization: Secondary | ICD-10-CM | POA: Diagnosis not present

## 2023-11-10 DIAGNOSIS — D510 Vitamin B12 deficiency anemia due to intrinsic factor deficiency: Secondary | ICD-10-CM | POA: Diagnosis not present

## 2023-11-10 DIAGNOSIS — E7800 Pure hypercholesterolemia, unspecified: Secondary | ICD-10-CM | POA: Diagnosis not present

## 2023-11-10 DIAGNOSIS — I10 Essential (primary) hypertension: Secondary | ICD-10-CM | POA: Diagnosis not present

## 2023-11-10 DIAGNOSIS — G250 Essential tremor: Secondary | ICD-10-CM | POA: Diagnosis not present

## 2023-11-10 DIAGNOSIS — Z0000 Encounter for general adult medical examination without abnormal findings: Secondary | ICD-10-CM | POA: Diagnosis not present
